# Patient Record
Sex: Female | Born: 1990 | Race: White | Hispanic: No | Marital: Single | Smoking: Current every day smoker
Health system: Southern US, Community
[De-identification: ages and names within clinical notes are randomized; demographics above are authoritative.]

## PROBLEM LIST (undated history)

## (undated) DIAGNOSIS — F419 Anxiety disorder, unspecified: Secondary | ICD-10-CM

## (undated) HISTORY — PX: CHOLECYSTECTOMY: SHX55

---

## 2014-01-25 ENCOUNTER — Emergency Department: Payer: Self-pay | Admitting: Emergency Medicine

## 2014-04-23 ENCOUNTER — Emergency Department: Payer: Self-pay | Admitting: Internal Medicine

## 2014-05-01 ENCOUNTER — Emergency Department: Payer: Self-pay | Admitting: Emergency Medicine

## 2014-06-03 ENCOUNTER — Inpatient Hospital Stay: Payer: Self-pay | Admitting: Psychiatry

## 2014-06-03 LAB — COMPREHENSIVE METABOLIC PANEL
ALK PHOS: 82 U/L
ALT: 58 U/L
Albumin: 4.2 g/dL (ref 3.4–5.0)
Anion Gap: 9 (ref 7–16)
BILIRUBIN TOTAL: 0.6 mg/dL (ref 0.2–1.0)
BUN: 10 mg/dL (ref 7–18)
CO2: 24 mmol/L (ref 21–32)
CREATININE: 0.71 mg/dL (ref 0.60–1.30)
Calcium, Total: 9.2 mg/dL (ref 8.5–10.1)
Chloride: 104 mmol/L (ref 98–107)
EGFR (African American): >60
Glucose: 103 mg/dL — ABNORMAL HIGH (ref 65–99)
OSMOLALITY: 273 (ref 275–301)
Potassium: 3.7 mmol/L (ref 3.5–5.1)
SGOT(AST): 41 U/L — ABNORMAL HIGH (ref 15–37)
SODIUM: 137 mmol/L (ref 136–145)
Total Protein: 8.2 g/dL (ref 6.4–8.2)

## 2014-06-03 LAB — CBC
HCT: 46.3 % (ref 35.0–47.0)
HGB: 15.5 g/dL (ref 12.0–16.0)
MCH: 29.5 pg (ref 26.0–34.0)
MCHC: 33.5 g/dL (ref 32.0–36.0)
MCV: 88 fL (ref 80–100)
PLATELETS: 327 10*3/uL (ref 150–440)
RBC: 5.25 10*6/uL — ABNORMAL HIGH (ref 3.80–5.20)
RDW: 13.3 % (ref 11.5–14.5)
WBC: 13.3 10*3/uL — ABNORMAL HIGH (ref 3.6–11.0)

## 2014-06-03 LAB — ACETAMINOPHEN LEVEL: Acetaminophen: <2 ug/mL

## 2014-06-03 LAB — URINALYSIS, COMPLETE
BACTERIA: NONE SEEN
BILIRUBIN, UR: NEGATIVE
Blood: NEGATIVE
GLUCOSE, UR: NEGATIVE mg/dL (ref 0–75)
Leukocyte Esterase: NEGATIVE
Nitrite: NEGATIVE
Ph: 6 (ref 4.5–8.0)
Protein: 30
SPECIFIC GRAVITY: 1.025 (ref 1.003–1.030)

## 2014-06-03 LAB — DRUG SCREEN, URINE
AMPHETAMINES, UR SCREEN: NEGATIVE (ref ?–1000)
BARBITURATES, UR SCREEN: NEGATIVE (ref ?–200)
BENZODIAZEPINE, UR SCRN: NEGATIVE (ref ?–200)
Cannabinoid 50 Ng, Ur ~~LOC~~: POSITIVE (ref ?–50)
Cocaine Metabolite,Ur ~~LOC~~: NEGATIVE (ref ?–300)
MDMA (Ecstasy)Ur Screen: NEGATIVE (ref ?–500)
Methadone, Ur Screen: NEGATIVE (ref ?–300)
Opiate, Ur Screen: NEGATIVE (ref ?–300)
Phencyclidine (PCP) Ur S: NEGATIVE (ref ?–25)
TRICYCLIC, UR SCREEN: NEGATIVE (ref ?–1000)

## 2014-06-03 LAB — PREGNANCY, URINE: PREGNANCY TEST, URINE: NEGATIVE m[IU]/mL

## 2014-06-03 LAB — ETHANOL: Ethanol: <3 mg/dL

## 2014-06-03 LAB — SALICYLATE LEVEL: SALICYLATES, SERUM: 2.3 mg/dL

## 2014-08-13 ENCOUNTER — Emergency Department: Payer: Self-pay | Admitting: Emergency Medicine

## 2014-08-13 LAB — COMPREHENSIVE METABOLIC PANEL
ALBUMIN: 4.9 g/dL
ALK PHOS: 75 U/L
Anion Gap: 9 (ref 7–16)
BILIRUBIN TOTAL: 0.8 mg/dL
BUN: 10 mg/dL
Calcium, Total: 9.6 mg/dL
Chloride: 105 mmol/L
Co2: 23 mmol/L
Creatinine: 0.62 mg/dL
EGFR (African American): >60
EGFR (Non-African Amer.): >60
Glucose: 99 mg/dL
Potassium: 3.9 mmol/L
SGOT(AST): 29 U/L
SGPT (ALT): 43 U/L
Sodium: 137 mmol/L
Total Protein: 8.1 g/dL

## 2014-08-13 LAB — URINALYSIS, COMPLETE
BLOOD: NEGATIVE
Bacteria: NONE SEEN
Bilirubin,UR: NEGATIVE
Glucose,UR: NEGATIVE mg/dL (ref 0–75)
Ketone: NEGATIVE
LEUKOCYTE ESTERASE: NEGATIVE
NITRITE: NEGATIVE
PH: 6 (ref 4.5–8.0)
PROTEIN: NEGATIVE
RBC,UR: NONE SEEN /HPF (ref 0–5)
Specific Gravity: 1.016 (ref 1.003–1.030)
Squamous Epithelial: 11
WBC UR: 4 /HPF (ref 0–5)

## 2014-08-13 LAB — ETHANOL: Ethanol: <5 mg/dL

## 2014-08-13 LAB — ACETAMINOPHEN LEVEL: Acetaminophen: <10 ug/mL

## 2014-08-13 LAB — CBC
HCT: 44.9 % (ref 35.0–47.0)
HGB: 15 g/dL (ref 12.0–16.0)
MCH: 29 pg (ref 26.0–34.0)
MCHC: 33.4 g/dL (ref 32.0–36.0)
MCV: 87 fL (ref 80–100)
Platelet: 323 10*3/uL (ref 150–440)
RBC: 5.16 10*6/uL (ref 3.80–5.20)
RDW: 13.2 % (ref 11.5–14.5)
WBC: 12.7 10*3/uL — ABNORMAL HIGH (ref 3.6–11.0)

## 2014-08-13 LAB — DRUG SCREEN, URINE
Amphetamines, Ur Screen: NEGATIVE (ref ?–1000)
Barbiturates, Ur Screen: NEGATIVE (ref ?–200)
Benzodiazepine, Ur Scrn: NEGATIVE (ref ?–200)
CANNABINOID 50 NG, UR ~~LOC~~: POSITIVE (ref ?–50)
Cocaine Metabolite,Ur ~~LOC~~: NEGATIVE (ref ?–300)
MDMA (Ecstasy)Ur Screen: NEGATIVE (ref ?–500)
METHADONE, UR SCREEN: NEGATIVE (ref ?–300)
Opiate, Ur Screen: NEGATIVE (ref ?–300)
Phencyclidine (PCP) Ur S: NEGATIVE (ref ?–25)
Tricyclic, Ur Screen: NEGATIVE (ref ?–1000)

## 2014-08-13 LAB — SALICYLATE LEVEL: Salicylates, Serum: <4 mg/dL

## 2014-09-20 NOTE — Consult Note (Signed)
PATIENT NAME:  [name] [name], MALSON MR#:  [id number] DATE OF BIRTH:  1990/10/15  DATE OF CONSULTATION:  06/03/2014  REFERRING PHYSICIAN:   CONSULTING PHYSICIAN:  Audery Amel, MD  IDENTIFYING INFORMATION AND REASON FOR CONSULTATION: A 24 year old woman petitioned by her boyfriend with reports that she was threatening to kill herself.   CHIEF COMPLAINT: "I should have never stopped taking my medicine."   HISTORY OF PRESENT ILLNESS: Information obtained from the patient and the chart. The commitment paperwork reports that she was making several statements about killing herself. The patient says that her mood has been getting progressively worse for months. She feels depressed, irritable, anxious, and stressed out. Major stresses include not making enough money and some conflict with her boyfriend. She has been sleeping somewhat poorly. She denies any drug abuse. She is not currently taking any medication for depression or anxiety. She cut herself on the thigh intentionally recently and admits that she has been having thoughts about killing herself, feeling more sad and overwhelmed. Denies any psychotic symptoms.   PAST PSYCHIATRIC HISTORY: The patient has had 2 prior psychiatric hospitalizations, both at [facility], the most recent one about a year ago. She has been diagnosed variously as bipolar and borderline personality disorder. Multiple medications have been tried including Trileptal and Lamictal, but she found buspirone to be the most helpful. Abilify caused intolerable weight gain. Does have a history of self-mutilation in the past and suicidal threats.   SUBSTANCE ABUSE HISTORY: Alcohol use is infrequent. Uses marijuana intermittently, last use 2 days ago, has not been escalating the use.   SOCIAL HISTORY: The patient works as a Public librarian. Has not been getting much work recently and is struggling financially. She moved to the area here to be closer to her boyfriend, but they have not been  getting along. She has a 77-year-old daughter who lives at home as well.   FAMILY HISTORY: Has a mother with bipolar disorder.   PAST MEDICAL HISTORY: Mild situational asthma. No other ongoing medical problems.   CURRENT MEDICINES: None.   ALLERGIES: LATEX.   REVIEW OF SYSTEMS: Depressed mood. Anxious. No hallucinations. No active suicidal intent. Feeling run down, poor sleep at night.   MENTAL STATUS EXAMINATION: Disheveled woman, looks her stated age, cooperative with the interview. Decreased eye contact. Slow psychomotor activity. Speech normal rate, tone, and volume. Affect dysphoric but reactive. Mood stated as depressed. Thoughts are lucid without loosening of associations. Denies hallucinations. Has passive suicidal ideation. No homicidal ideation. She is alert and oriented x 4. Can remember 3 out of 3 objects immediately and at 3 minutes. Judgment and insight intact. Intelligence normal.   LABORATORY RESULTS: Chemistry panel unremarkable. Drug screen positive for cannabis. CBC, elevated white count at 13.3. Urinalysis positive for protein and cells, probably contaminated.   VITAL SIGNS: Blood pressure 147/94, respirations 20, pulse 117, temperature 98.8.   ASSESSMENT: A 24 year old woman with a history of mood instability with prior suicide attempts and self-mutilation, once again self-mutilating, making suicidal threats, feeling emotionally out of control, major stress. Needs hospitalization for safety.   TREATMENT PLAN: Admit to psychiatry. Suicide precautions and close precautions in place. Restart buspirone 10 mg twice a day.   DIAGNOSIS PRINCIPAL AND PRIMARY:   AXIS I: Major depression, recurrent, severe.   SECONDARY DIAGNOSES:   AXIS I: Marijuana abuse, mild.   AXIS II: Deferred, but rule out borderline personality disorder.   AXIS III: Mild asthma.     ____________________________ Audery Amel, MD jtc:bu  D: 06/03/2014 17:38:21 ET T: 06/03/2014 18:03:22  ET JOB#: [id number]  cc: Audery AmelJohn T. Clapacs, MD, <Dictator> Audery AmelJOHN T CLAPACS MD ELECTRONICALLY SIGNED 07/01/2014 17:18
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
## 2014-10-07 ENCOUNTER — Emergency Department
Admission: EM | Admit: 2014-10-07 | Discharge: 2014-10-07 | Disposition: A | Discharge status: Home / Self Care | Payer: Self-pay | Attending: Student | Admitting: Student

## 2014-10-07 DIAGNOSIS — Z720 Tobacco use: Secondary | ICD-10-CM | POA: Insufficient documentation

## 2014-10-07 DIAGNOSIS — J029 Acute pharyngitis, unspecified: Secondary | ICD-10-CM | POA: Insufficient documentation

## 2014-10-07 HISTORY — DX: Anxiety disorder, unspecified: F41.9

## 2014-10-07 MED ORDER — AMOXICILLIN 875 MG PO TABS: 875.0000 mg | ORAL_TABLET | Freq: Two times a day (BID) | ORAL | Status: AC

## 2014-10-07 MED ORDER — IBUPROFEN 800 MG PO TABS: 800.0000 mg | ORAL_TABLET | Freq: Three times a day (TID) | ORAL | Status: AC | PRN

## 2014-10-07 NOTE — ED Notes (Signed)
Sore throat, nasal congestion, headache, green nasal discharge. Pt alert and oriented X4, active, cooperative, pt in NAD. RR even and unlabored, color WNL.

## 2014-10-07 NOTE — ED Provider Notes (Signed)
[facility] Emergency Department Provider Note ____________________________________________  Time seen: Approximately 1045 AM  I have reviewed the triage vital signs and the nursing notes.   HISTORY  Chief Complaint Sore Throat and Nasal Congestion   HPI [name] [name] is a 24 y.o. female presents with the complaints of sore throat since yesterday. Patient states sudden onset and worse today. Patient describes pain at 6 out of 10 aching and scratchy and hurts to swallow. Patient states continues to drink fluidswell but hurts to eat. Patient also reports accompanying chills, denies fever. Denies cough, nasal congestion, congestion, shortness of breath, chest pain, abdominal pain, neck or back pain. Reports mild headache today described as achy 2 out of 10. Denies fall or injury.   Past Medical History  Diagnosis Date  . Anxiety    Denies any recent sickness or antibiotic use. There are no active problems to display for this patient.   Past Surgical History  Procedure Laterality Date  . Cholecystectomy    . Cesarean section      No home medications Allergies Latex and Mold extract  No family history on file.  Social History History  Substance Use Topics  . Smoking status: Current Every Day Smoker  . Smokeless tobacco: Not on file  . Alcohol Use: Yes    Review of Systems Constitutional: positive for chills Eyes: No visual changes. ENT: positive for sore throat Cardiovascular: Denies chest pain. Respiratory: Denies shortness of breath. Gastrointestinal: No abdominal pain.  No nausea, no vomiting.  No diarrhea.  No constipation. Genitourinary: Negative for dysuria. Musculoskeletal: Negative for neck or back pain. Skin: Negative for rash. Neurological: Negative for focal weakness or numbness.  10-point ROS otherwise negative.  ____________________________________________   PHYSICAL EXAM:  VITAL SIGNS: ED Triage Vitals  Enc Vitals  Group     BP 10/07/14 1024 155/94 mmHg     Pulse Rate 10/07/14 1024 87     Resp 10/07/14 1024 18     Temp 10/07/14 1024 98.5 F (36.9 C)     Temp Source 10/07/14 1024 Oral     SpO2 10/07/14 1024 96 %     Weight 10/07/14 1024 200 lb (90.719 kg)     Height 10/07/14 1024 4\' 9"  (1.448 m)     Head Cir --      Peak Flow --      Pain Score 10/07/14 1024 7     Pain Loc --      Pain Edu? --      Excl. in GC? --     Constitutional: Alert and oriented. Well appearing and in no acute distress. Eyes: Conjunctivae are normal. PERRL. EOMI. Head: Atraumatic. Nose: No congestion/rhinnorhea. Mouth/Throat: Mucous membranes are moist.  Bilateral mild to moderate tonsillar swelling with moderate erythema. No exudate. No uvular shift or deviation.  Neck: No stridor. No cervical spine tenderness to palpation.no pain with range of motion. Hematological/Lymphatic/Immunilogical: mild anterior cervical lymphadenopathy Cardiovascular: Normal rate, regular rhythm. Grossly normal heart sounds.  Good peripheral circulation. Respiratory: Normal respiratory effort.  No retractions. Lungs CTAB. Gastrointestinal: Soft and nontender. No distention. No abdominal bruits. No CVA tenderness. Musculoskeletal: No lower extremity tenderness nor edema.  No joint effusions.no neck or back tenderness. Neurologic:  Normal speech and language. No gross focal neurologic deficits are appreciated. Speech is normal. No gait instability. Skin:  Skin is warm, dry and intact. No rash noted. Psychiatric: Mood and affect are normal. Speech and behavior are normal.    INITIAL IMPRESSION /  ASSESSMENT AND PLAN / ED COURSE  Pertinent labs & imaging results that were available during my care of the patient were reviewed by me and considered in my medical decision making (see chart for details).  No acute distress. Patient very well-appearing. Presents to complaints of sore throat and chills since yesterday. Continues to tolerate fluids  well. Suspect Streptococcus pharyngitis, Will culture strep swab. Will treat patient with outpatient amoxicillin orally. Patient to follow-up primary care physician next week. Discussed follow-up and return parameters. Patient agreed to plan.  ____________________________________________   FINAL CLINICAL IMPRESSION(S) / ED DIAGNOSES  Final diagnoses:  Pharyngitis     Renford DillsLindsey Cayenne Breault, NP 10/07/14 1117  Gayla DossEryka A Gayle, MD 10/07/14 1131

## 2014-10-07 NOTE — ED Notes (Signed)
Pt alert and oriented X4, active, cooperative, pt in NAD. RR even and unlabored, color WNL.  Pt informed to return if any life threatening symptoms occur.   

## 2014-10-07 NOTE — Discharge Instructions (Signed)
Take medication as prescribed. Rest. Drink plenty of water.  Follow-up with your primary care physician next week or the above as needed.  Return to the ER for new or worsening concerns.  Pharyngitis Pharyngitis is redness, pain, and swelling (inflammation) of your pharynx.  CAUSES  Pharyngitis is usually caused by infection. Most of the time, these infections are from viruses (viral) and are part of a cold. However, sometimes pharyngitis is caused by bacteria (bacterial). Pharyngitis can also be caused by allergies. Viral pharyngitis may be spread from person to person by coughing, sneezing, and personal items or utensils (cups, forks, spoons, toothbrushes). Bacterial pharyngitis may be spread from person to person by more intimate contact, such as kissing.  SIGNS AND SYMPTOMS  Symptoms of pharyngitis include:   Sore throat.   Tiredness (fatigue).   Low-grade fever.   Headache.  Joint pain and muscle aches.  Skin rashes.  Swollen lymph nodes.  Plaque-like film on throat or tonsils (often seen with bacterial pharyngitis). DIAGNOSIS  Your health care provider will ask you questions about your illness and your symptoms. Your medical history, along with a physical exam, is often all that is needed to diagnose pharyngitis. Sometimes, a rapid strep test is done. Other lab tests may also be done, depending on the suspected cause.  TREATMENT  Viral pharyngitis will usually get better in 3-4 days without the use of medicine. Bacterial pharyngitis is treated with medicines that kill germs (antibiotics).  HOME CARE INSTRUCTIONS   Drink enough water and fluids to keep your urine clear or pale yellow.   Only take over-the-counter or prescription medicines as directed by your health care provider:   If you are prescribed antibiotics, make sure you finish them even if you start to feel better.   Do not take aspirin.   Get lots of rest.   Gargle with 8 oz of salt water ( tsp of  salt per 1 qt of water) as often as every 1-2 hours to soothe your throat.   Throat lozenges (if you are not at risk for choking) or sprays may be used to soothe your throat. SEEK MEDICAL CARE IF:   You have large, tender lumps in your neck.  You have a rash.  You cough up green, yellow-brown, or bloody spit. SEEK IMMEDIATE MEDICAL CARE IF:   Your neck becomes stiff.  You drool or are unable to swallow liquids.  You vomit or are unable to keep medicines or liquids down.  You have severe pain that does not go away with the use of recommended medicines.  You have trouble breathing (not caused by a stuffy nose). MAKE SURE YOU:   Understand these instructions.  Will watch your condition.  Will get help right away if you are not doing well or get worse. Document Released: 05/08/2005 Document Revised: 02/26/2013 Document Reviewed: 01/13/2013 [facility] Patient Information 2015 [employer] [place]. This information is not intended to replace advice given to you by your health care provider. Make sure you discuss any questions you have with your health care provider.

## 2014-10-10 LAB — CULTURE, GROUP A STREP (THRC)

## 2015-04-25 IMAGING — CR DG CHEST 2V
1 series · 2 of 2 positions shown · non-contrast
Comparison: None.

CLINICAL DATA: Cough, shortness of breath and wheezing.

EXAM:
CHEST  2 VIEW

[Series 1: dxr chest pa (or ap) and lateral · 0.14mm/px · 2 of 2 slices shown]
[im 1/2]
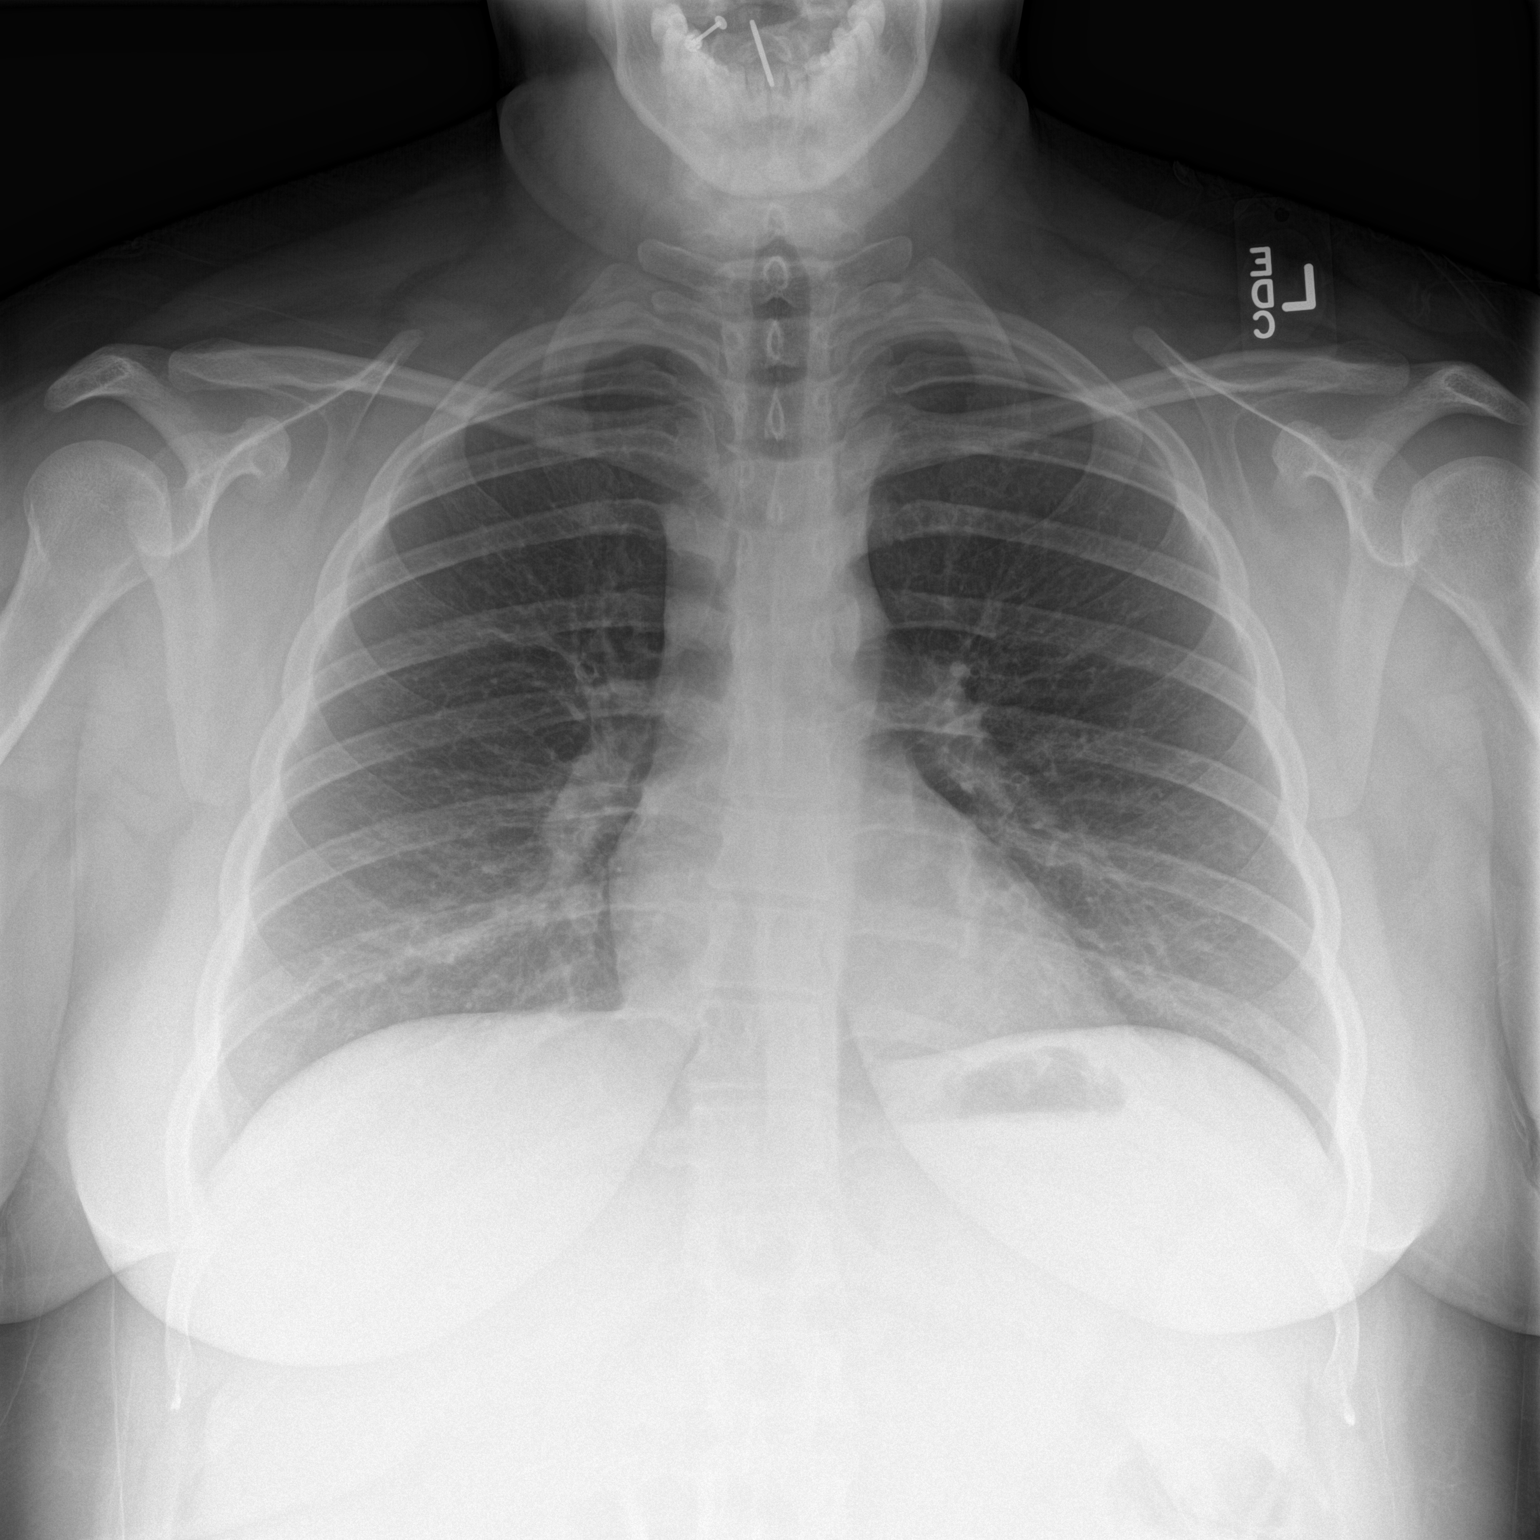
[im 2/2]
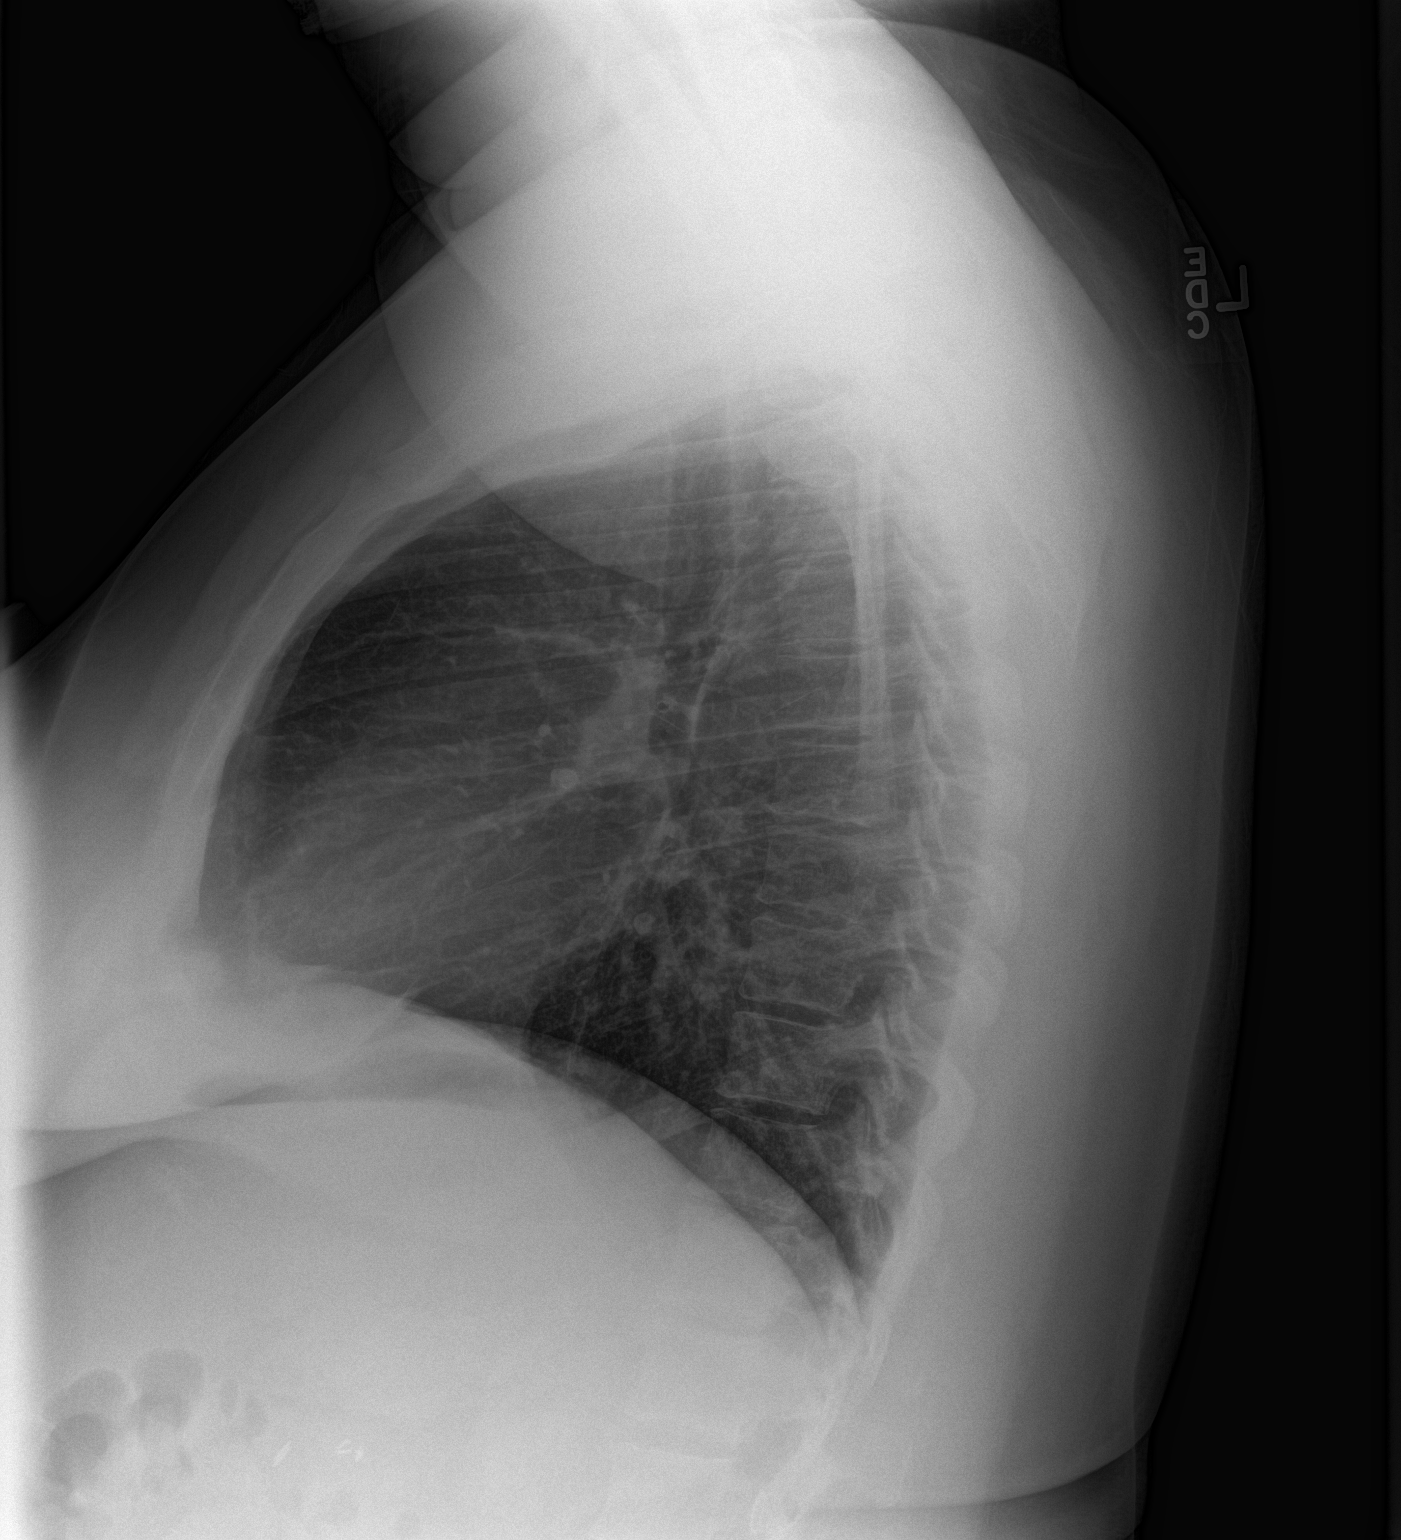

[2 of 2 positions shown; findings below may reference images not displayed]

FINDINGS: Cardiomediastinal silhouette is unremarkable. The lungs are clear
without pleural effusions or focal consolidations. Trachea projects
midline and there is no pneumothorax. Soft tissue planes and
included osseous structures are non-suspicious. Surgical clips in
the included right abdomen likely reflect cholecystectomy. Large
body habitus. The.
IMPRESSION: No active cardiopulmonary disease.

  By: Moanty Safe
